# Patient Record
Sex: Female | Born: 1945 | Race: White | Hispanic: No | Marital: Married
Health system: Southern US, Community
[De-identification: ages and names within clinical notes are randomized; demographics above are authoritative.]

---

## 1999-01-28 ENCOUNTER — Other Ambulatory Visit: Admission: RE | Admit: 1999-01-28 | Discharge: 1999-01-28 | Payer: Self-pay | Admitting: Family Medicine

## 1999-01-30 ENCOUNTER — Encounter: Admission: RE | Admit: 1999-01-30 | Discharge: 1999-01-30 | Payer: Self-pay | Admitting: Family Medicine

## 1999-01-30 ENCOUNTER — Encounter: Payer: Self-pay | Admitting: Family Medicine

## 1999-10-06 ENCOUNTER — Encounter: Admission: RE | Admit: 1999-10-06 | Discharge: 1999-10-06 | Payer: Self-pay | Admitting: Family Medicine

## 1999-10-06 ENCOUNTER — Encounter: Payer: Self-pay | Admitting: Family Medicine

## 2001-04-05 ENCOUNTER — Encounter: Payer: Self-pay | Admitting: Family Medicine

## 2001-04-05 ENCOUNTER — Encounter: Admission: RE | Admit: 2001-04-05 | Discharge: 2001-04-05 | Payer: Self-pay | Admitting: Family Medicine

## 2001-08-04 ENCOUNTER — Emergency Department (HOSPITAL_COMMUNITY): Admission: EM | Admit: 2001-08-04 | Discharge: 2001-08-05 | Payer: Self-pay | Admitting: Emergency Medicine

## 2001-08-04 ENCOUNTER — Encounter: Payer: Self-pay | Admitting: Emergency Medicine

## 2004-08-06 ENCOUNTER — Encounter: Admission: RE | Admit: 2004-08-06 | Discharge: 2004-08-06 | Payer: Self-pay | Admitting: Family Medicine

## 2015-05-30 ENCOUNTER — Other Ambulatory Visit: Payer: Self-pay | Admitting: Family Medicine

## 2015-05-30 DIAGNOSIS — R1032 Left lower quadrant pain: Secondary | ICD-10-CM

## 2015-05-31 ENCOUNTER — Ambulatory Visit
Admission: RE | Admit: 2015-05-31 | Discharge: 2015-05-31 | Disposition: A | Discharge status: Home / Self Care | Payer: 59 | Source: Ambulatory Visit | Attending: Family Medicine | Admitting: Family Medicine

## 2015-05-31 DIAGNOSIS — R1032 Left lower quadrant pain: Secondary | ICD-10-CM

## 2015-05-31 MED ORDER — IOPAMIDOL (ISOVUE-300) INJECTION 61%
100.0000 mL | Freq: Once | INTRAVENOUS | Status: AC | PRN
  Administered 2015-05-31: 100 mL via INTRAVENOUS

## 2015-07-29 ENCOUNTER — Ambulatory Visit
Admission: RE | Admit: 2015-07-29 | Discharge: 2015-07-29 | Disposition: A | Discharge status: Home / Self Care | Payer: 59 | Source: Ambulatory Visit | Attending: Family Medicine | Admitting: Family Medicine

## 2015-07-29 ENCOUNTER — Other Ambulatory Visit: Payer: Self-pay | Admitting: Family Medicine

## 2015-07-29 DIAGNOSIS — R52 Pain, unspecified: Secondary | ICD-10-CM

## 2015-09-03 ENCOUNTER — Other Ambulatory Visit: Payer: Self-pay | Admitting: Family Medicine

## 2015-09-03 DIAGNOSIS — R1012 Left upper quadrant pain: Secondary | ICD-10-CM

## 2015-09-03 DIAGNOSIS — R9389 Abnormal findings on diagnostic imaging of other specified body structures: Secondary | ICD-10-CM

## 2015-09-03 DIAGNOSIS — Z8719 Personal history of other diseases of the digestive system: Secondary | ICD-10-CM

## 2015-09-06 ENCOUNTER — Ambulatory Visit
Admission: RE | Admit: 2015-09-06 | Discharge: 2015-09-06 | Disposition: A | Discharge status: Home / Self Care | Payer: 59 | Source: Ambulatory Visit | Attending: Family Medicine | Admitting: Family Medicine

## 2015-09-06 DIAGNOSIS — Z8719 Personal history of other diseases of the digestive system: Secondary | ICD-10-CM

## 2015-09-06 DIAGNOSIS — R1012 Left upper quadrant pain: Secondary | ICD-10-CM

## 2015-09-06 MED ORDER — IOPAMIDOL (ISOVUE-300) INJECTION 61%
100.0000 mL | Freq: Once | INTRAVENOUS | Status: AC | PRN
  Administered 2015-09-06: 100 mL via INTRAVENOUS

## 2015-09-10 ENCOUNTER — Ambulatory Visit
Admission: RE | Admit: 2015-09-10 | Discharge: 2015-09-10 | Disposition: A | Discharge status: Home / Self Care | Payer: 59 | Source: Ambulatory Visit | Attending: Family Medicine | Admitting: Family Medicine

## 2015-09-10 DIAGNOSIS — R9389 Abnormal findings on diagnostic imaging of other specified body structures: Secondary | ICD-10-CM

## 2018-09-02 ENCOUNTER — Other Ambulatory Visit: Payer: Self-pay

## 2018-09-02 ENCOUNTER — Other Ambulatory Visit: Payer: Self-pay | Admitting: Family Medicine

## 2018-09-02 ENCOUNTER — Ambulatory Visit
Admission: RE | Admit: 2018-09-02 | Discharge: 2018-09-02 | Disposition: A | Discharge status: Home / Self Care | Payer: 59 | Source: Ambulatory Visit | Attending: Family Medicine | Admitting: Family Medicine

## 2018-09-02 DIAGNOSIS — R42 Dizziness and giddiness: Secondary | ICD-10-CM

## 2018-09-02 DIAGNOSIS — R5383 Other fatigue: Secondary | ICD-10-CM

## 2019-03-20 ENCOUNTER — Other Ambulatory Visit: Payer: Self-pay | Admitting: Cardiology

## 2019-03-20 DIAGNOSIS — Z20822 Contact with and (suspected) exposure to covid-19: Secondary | ICD-10-CM

## 2019-03-21 LAB — NOVEL CORONAVIRUS, NAA: SARS-CoV-2, NAA: NOT DETECTED

## 2019-05-26 ENCOUNTER — Ambulatory Visit: Payer: 59 | Attending: Internal Medicine

## 2019-05-26 DIAGNOSIS — Z23 Encounter for immunization: Secondary | ICD-10-CM | POA: Insufficient documentation

## 2019-05-26 NOTE — Progress Notes (Signed)
   Covid-[facility] Vaccination [facility]  Name:  Jaid Quirion    MRN: [id number] DOB: 1946-01-20  05/26/2019  Ms. Melena was observed post Covid-19 immunization for 15 minutes without incidence. She was provided with Vaccine Information Sheet and instruction to access the V-Safe system.   Ms. Paganelli was instructed to call 911 with any severe reactions post vaccine: Marland Kitchen Difficulty breathing  . Swelling of your face and throat  . A fast heartbeat  . A bad rash all over your body  . Dizziness and weakness    Immunizations Administered    Name Date Dose VIS Date Route   Pfizer COVID-19 Vaccine 05/26/2019  3:18 PM 0.3 mL 03/10/2019 Intramuscular   Manufacturer: [employer], [employer]   Lot: [id number]   NDC: [address]-4

## 2019-06-21 ENCOUNTER — Ambulatory Visit: Payer: 59 | Attending: Internal Medicine

## 2019-06-21 DIAGNOSIS — Z23 Encounter for immunization: Secondary | ICD-10-CM

## 2019-06-21 NOTE — Progress Notes (Signed)
   Covid-19 Vaccination Clinic  Name:  Ariel Mcintosh    MRN: [id number] DOB: 05/09/1945  06/21/2019  [name] was observed post Covid-19 immunization for 15 minutes without incident. She was provided with Vaccine Information Sheet and instruction to access the V-Safe system.   [name] was instructed to call 911 with any severe reactions post vaccine: Marland Kitchen Difficulty breathing  . Swelling of face and throat  . A fast heartbeat  . A bad rash all over body  . Dizziness and weakness   Immunizations Administered    Name Date Dose VIS Date Route   Pfizer COVID-19 Vaccine 06/21/2019 10:15 AM 0.3 mL 03/10/2019 Intramuscular   Manufacturer: [employer], [employer]   Lot: [id number]   NDC: [address]-7

## 2020-12-18 ENCOUNTER — Other Ambulatory Visit: Payer: Self-pay | Admitting: Family Medicine

## 2020-12-18 ENCOUNTER — Ambulatory Visit
Admission: RE | Admit: 2020-12-18 | Discharge: 2020-12-18 | Disposition: A | Discharge status: Home / Self Care | Payer: Medicare HMO | Source: Ambulatory Visit | Attending: Family Medicine | Admitting: Family Medicine

## 2020-12-18 ENCOUNTER — Other Ambulatory Visit: Payer: Self-pay

## 2020-12-18 DIAGNOSIS — M545 Low back pain, unspecified: Secondary | ICD-10-CM

## 2023-01-31 IMAGING — CR DG LUMBAR SPINE COMPLETE 4+V
5 series · 5 of 5 positions shown · non-contrast
Comparison: CT abdomen pelvis dated 10/06/2014.

CLINICAL DATA: Low back pain.

EXAM:
LUMBAR SPINE - COMPLETE 4+ VIEW

[w lumbar spine ap]
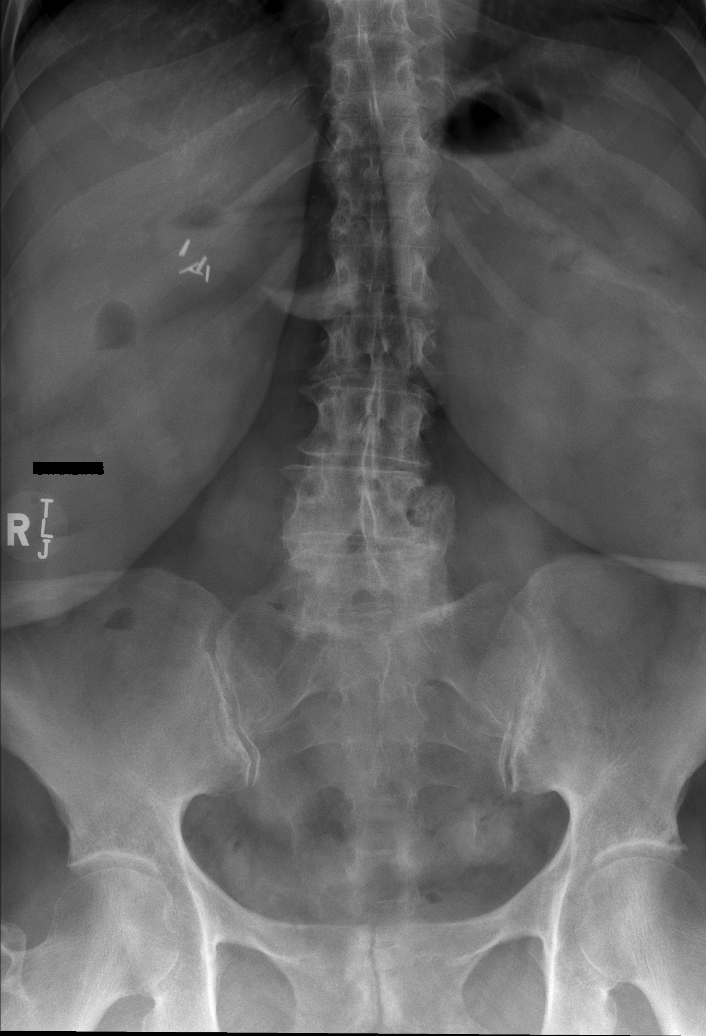

[w lumbar spine obl (1 of 2)]
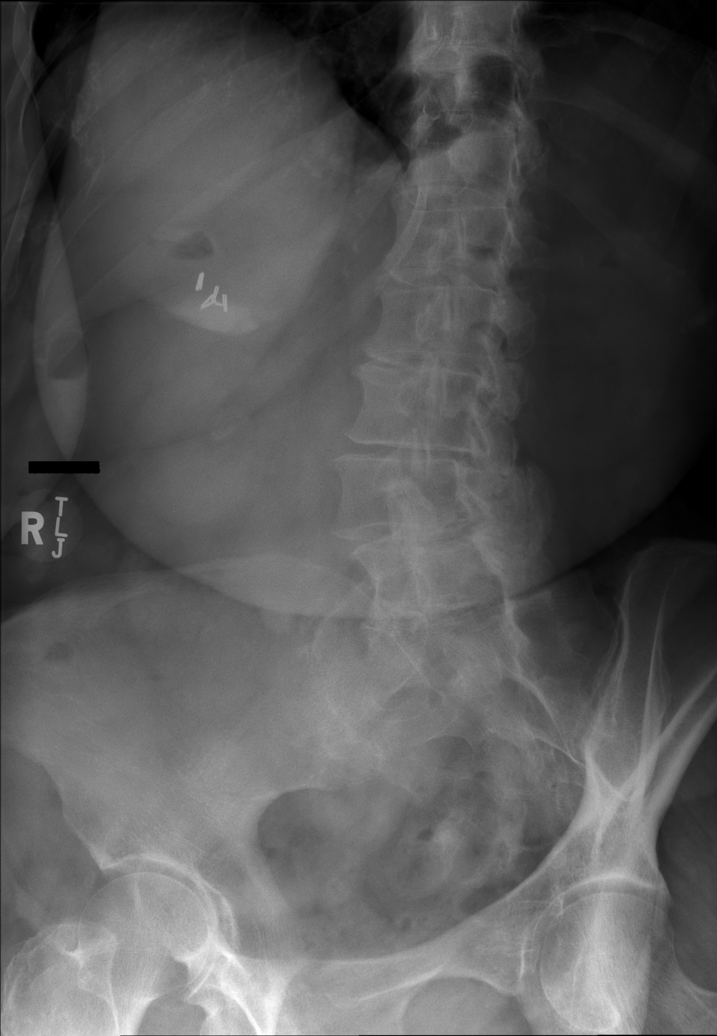

[w lumbar spine obl (2 of 2)]
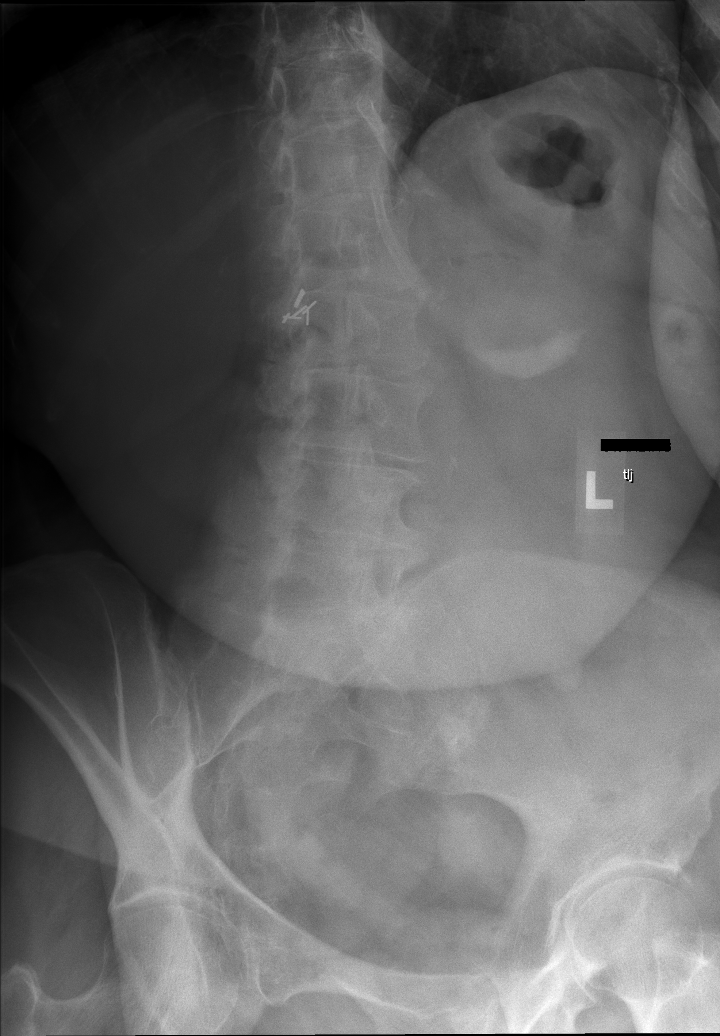

[w lumbar spine lat]
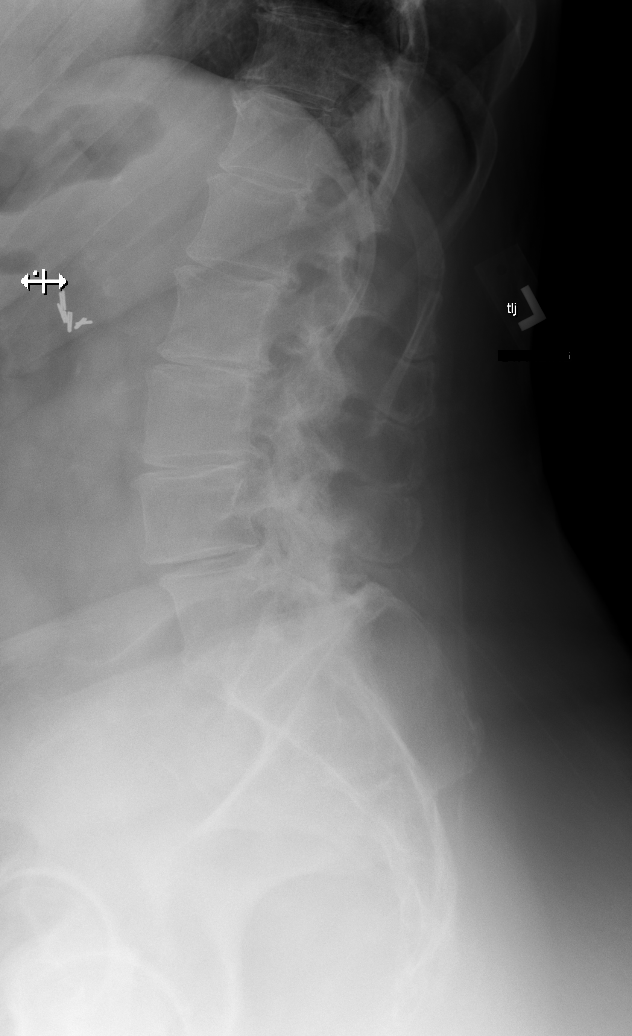

[w lumbar l-5 s-1 spot]
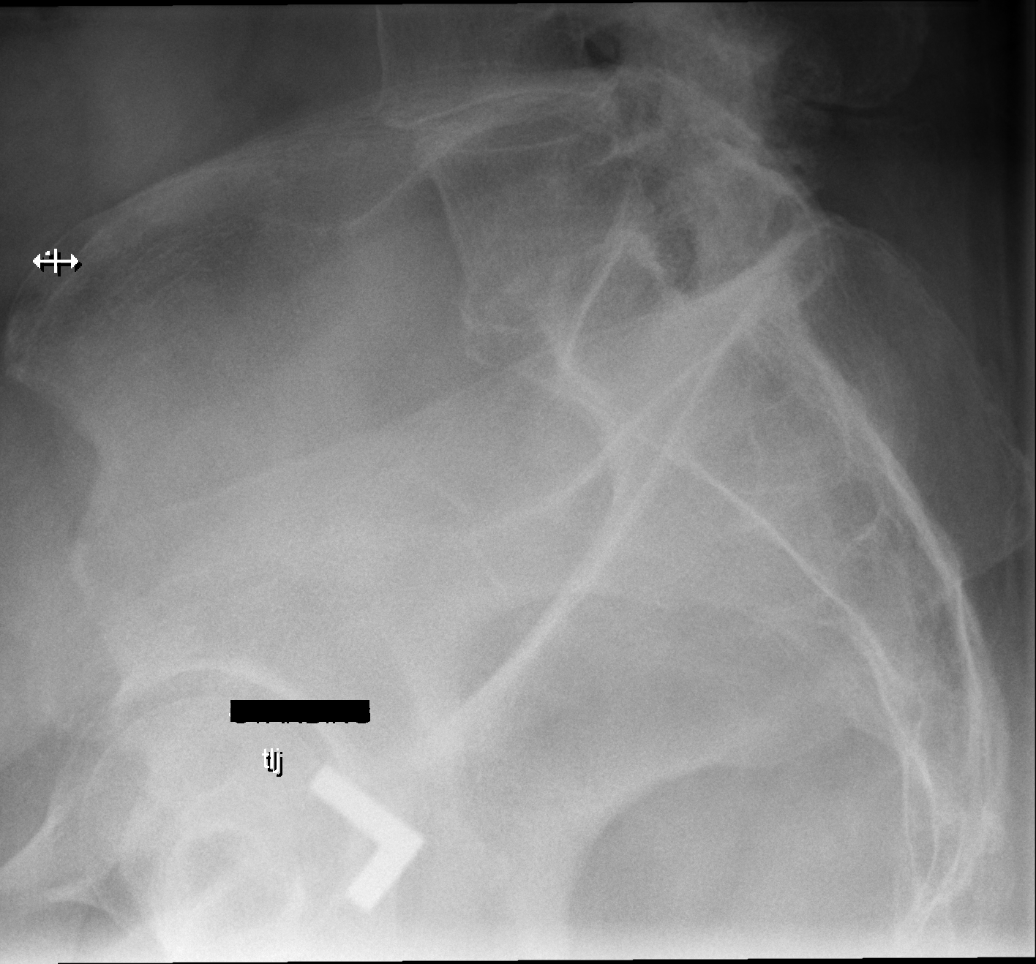

[5 of 5 positions shown; findings below may reference images not displayed]

FINDINGS: Five lumbar type vertebra. There is no acute fracture or subluxation
of the lumbar spine. Multilevel degenerative changes with disc space
narrowing and endplate irregularity. Multilevel facet arthropathy.
Right upper quadrant cholecystectomy clips. The soft tissues are
unremarkable
IMPRESSION: 1. No acute/traumatic lumbar spine pathology.
2. Multilevel degenerative changes.
